# Patient Record
Sex: Female | Born: 1950 | Race: White | Hispanic: No | Marital: Married | Smoking: Never smoker
Health system: Southern US, Community
[De-identification: ages and names within clinical notes are randomized; demographics above are authoritative.]

## PROBLEM LIST (undated history)

## (undated) DIAGNOSIS — T7840XA Allergy, unspecified, initial encounter: Secondary | ICD-10-CM

## (undated) DIAGNOSIS — I10 Essential (primary) hypertension: Secondary | ICD-10-CM

## (undated) HISTORY — PX: ABDOMINAL HYSTERECTOMY: SHX81

## (undated) HISTORY — DX: Essential (primary) hypertension: I10

## (undated) HISTORY — PX: CATARACT EXTRACTION, BILATERAL: SHX1313

## (undated) HISTORY — DX: Allergy, unspecified, initial encounter: T78.40XA

---

## 1997-12-02 ENCOUNTER — Other Ambulatory Visit: Admission: RE | Admit: 1997-12-02 | Discharge: 1997-12-02 | Payer: Self-pay | Admitting: *Deleted

## 1998-08-29 ENCOUNTER — Other Ambulatory Visit: Admission: RE | Admit: 1998-08-29 | Discharge: 1998-08-29 | Payer: Self-pay | Admitting: Obstetrics and Gynecology

## 1998-11-04 ENCOUNTER — Inpatient Hospital Stay (HOSPITAL_COMMUNITY): Admission: RE | Admit: 1998-11-04 | Discharge: 1998-11-06 | Payer: Self-pay | Admitting: Obstetrics and Gynecology

## 2000-10-07 ENCOUNTER — Ambulatory Visit (HOSPITAL_COMMUNITY): Admission: RE | Admit: 2000-10-07 | Discharge: 2000-10-07 | Payer: Self-pay | Admitting: Internal Medicine

## 2001-09-15 ENCOUNTER — Other Ambulatory Visit: Admission: RE | Admit: 2001-09-15 | Discharge: 2001-09-15 | Payer: Self-pay | Admitting: Obstetrics and Gynecology

## 2004-03-30 ENCOUNTER — Other Ambulatory Visit: Admission: RE | Admit: 2004-03-30 | Discharge: 2004-03-30 | Payer: Self-pay | Admitting: Internal Medicine

## 2004-08-16 ENCOUNTER — Emergency Department (HOSPITAL_COMMUNITY): Admission: EM | Admit: 2004-08-16 | Discharge: 2004-08-16 | Payer: Self-pay | Admitting: Family Medicine

## 2007-07-09 ENCOUNTER — Emergency Department (HOSPITAL_COMMUNITY): Admission: EM | Admit: 2007-07-09 | Discharge: 2007-07-09 | Payer: Self-pay | Admitting: Family Medicine

## 2007-10-21 ENCOUNTER — Emergency Department (HOSPITAL_COMMUNITY): Admission: EM | Admit: 2007-10-21 | Discharge: 2007-10-21 | Payer: Self-pay | Admitting: Emergency Medicine

## 2008-01-19 ENCOUNTER — Ambulatory Visit (HOSPITAL_COMMUNITY): Admission: RE | Admit: 2008-01-19 | Discharge: 2008-01-19 | Payer: Self-pay | Admitting: Internal Medicine

## 2009-04-17 IMAGING — CR DG ANKLE COMPLETE 3+V*L*
2 series · 2 of 2 positions shown · non-contrast
Comparison: None available

CLINICAL DATA: Fell

LEFT ANKLE COMPLETE - 3+ VIEW

[view not recorded (1 of 2)]
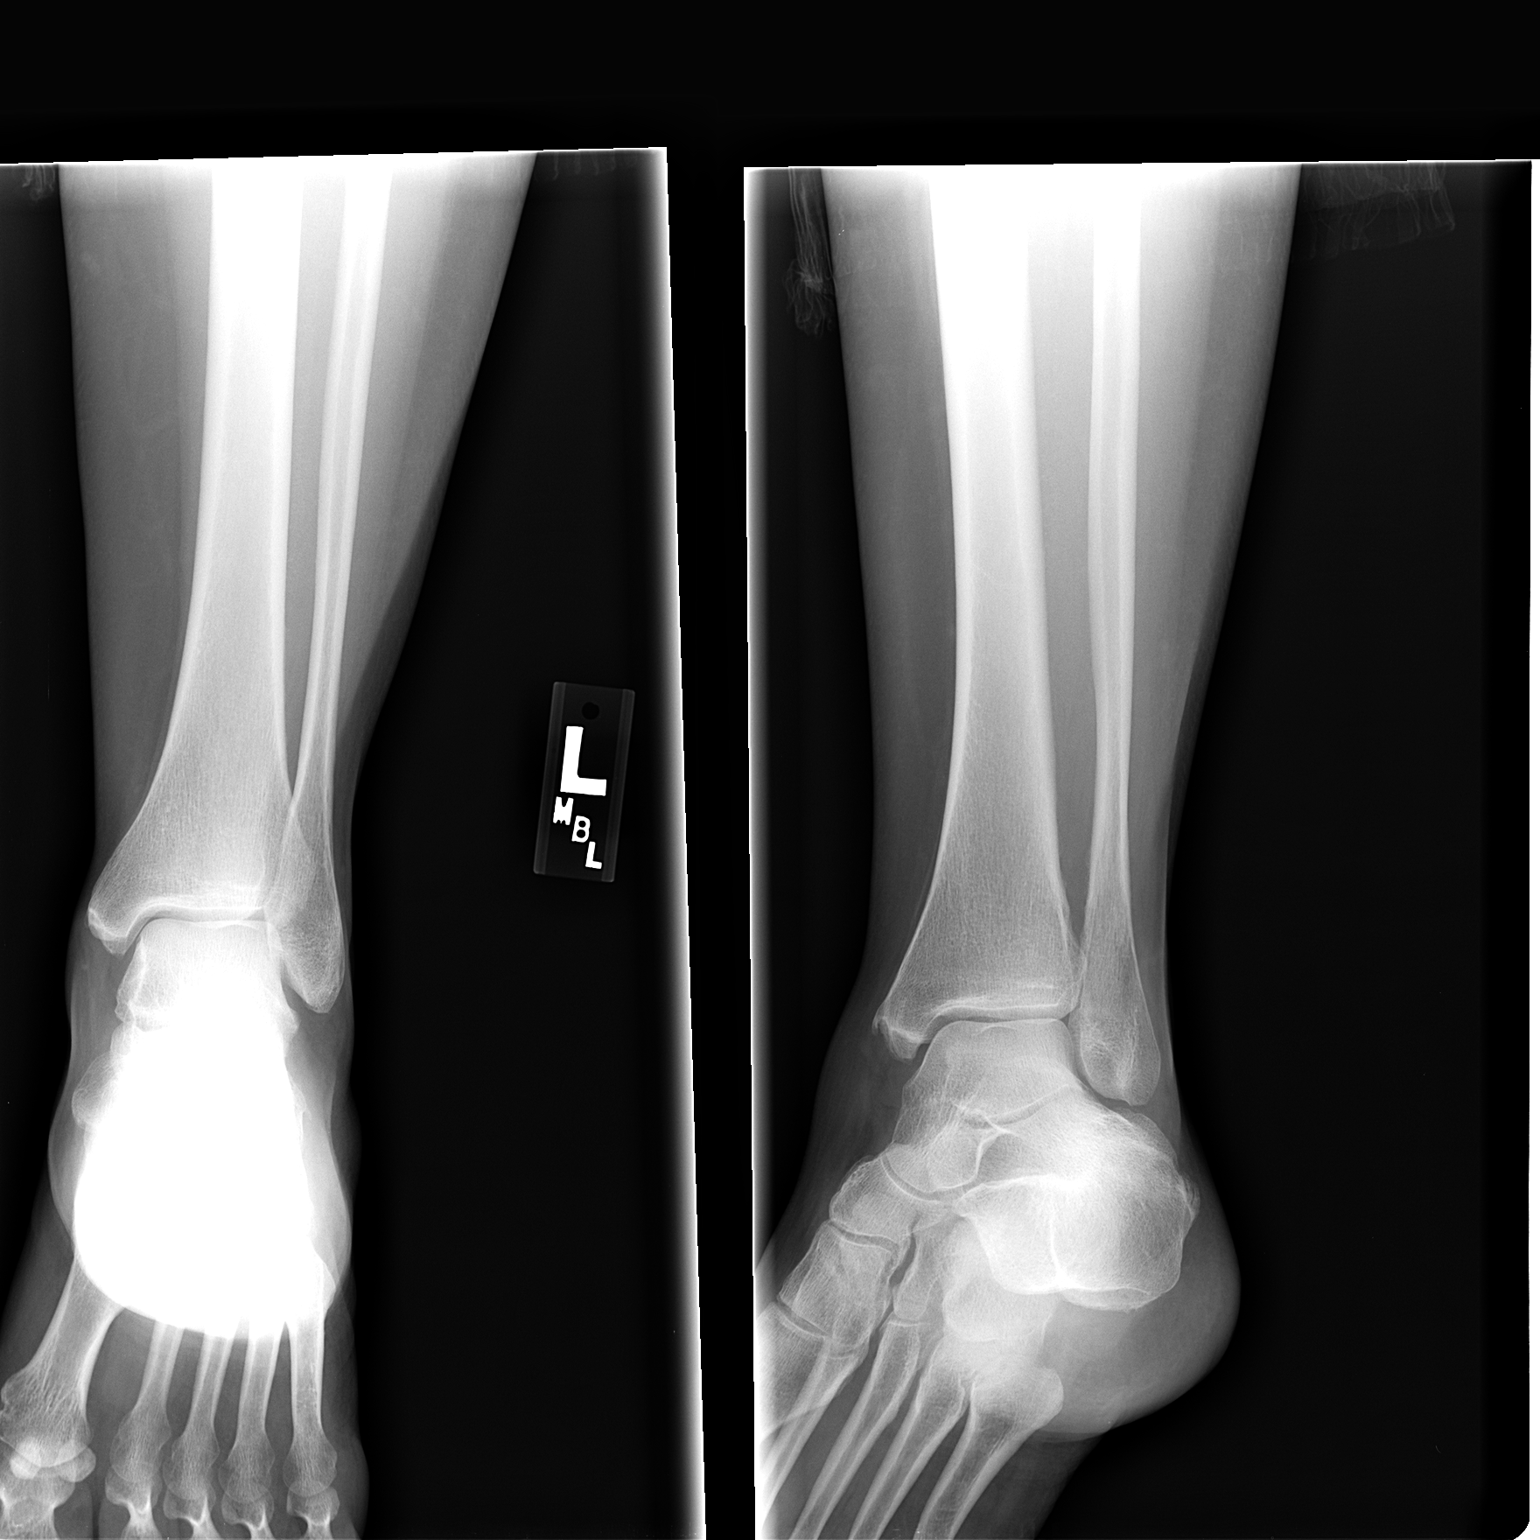

[view not recorded (2 of 2)]
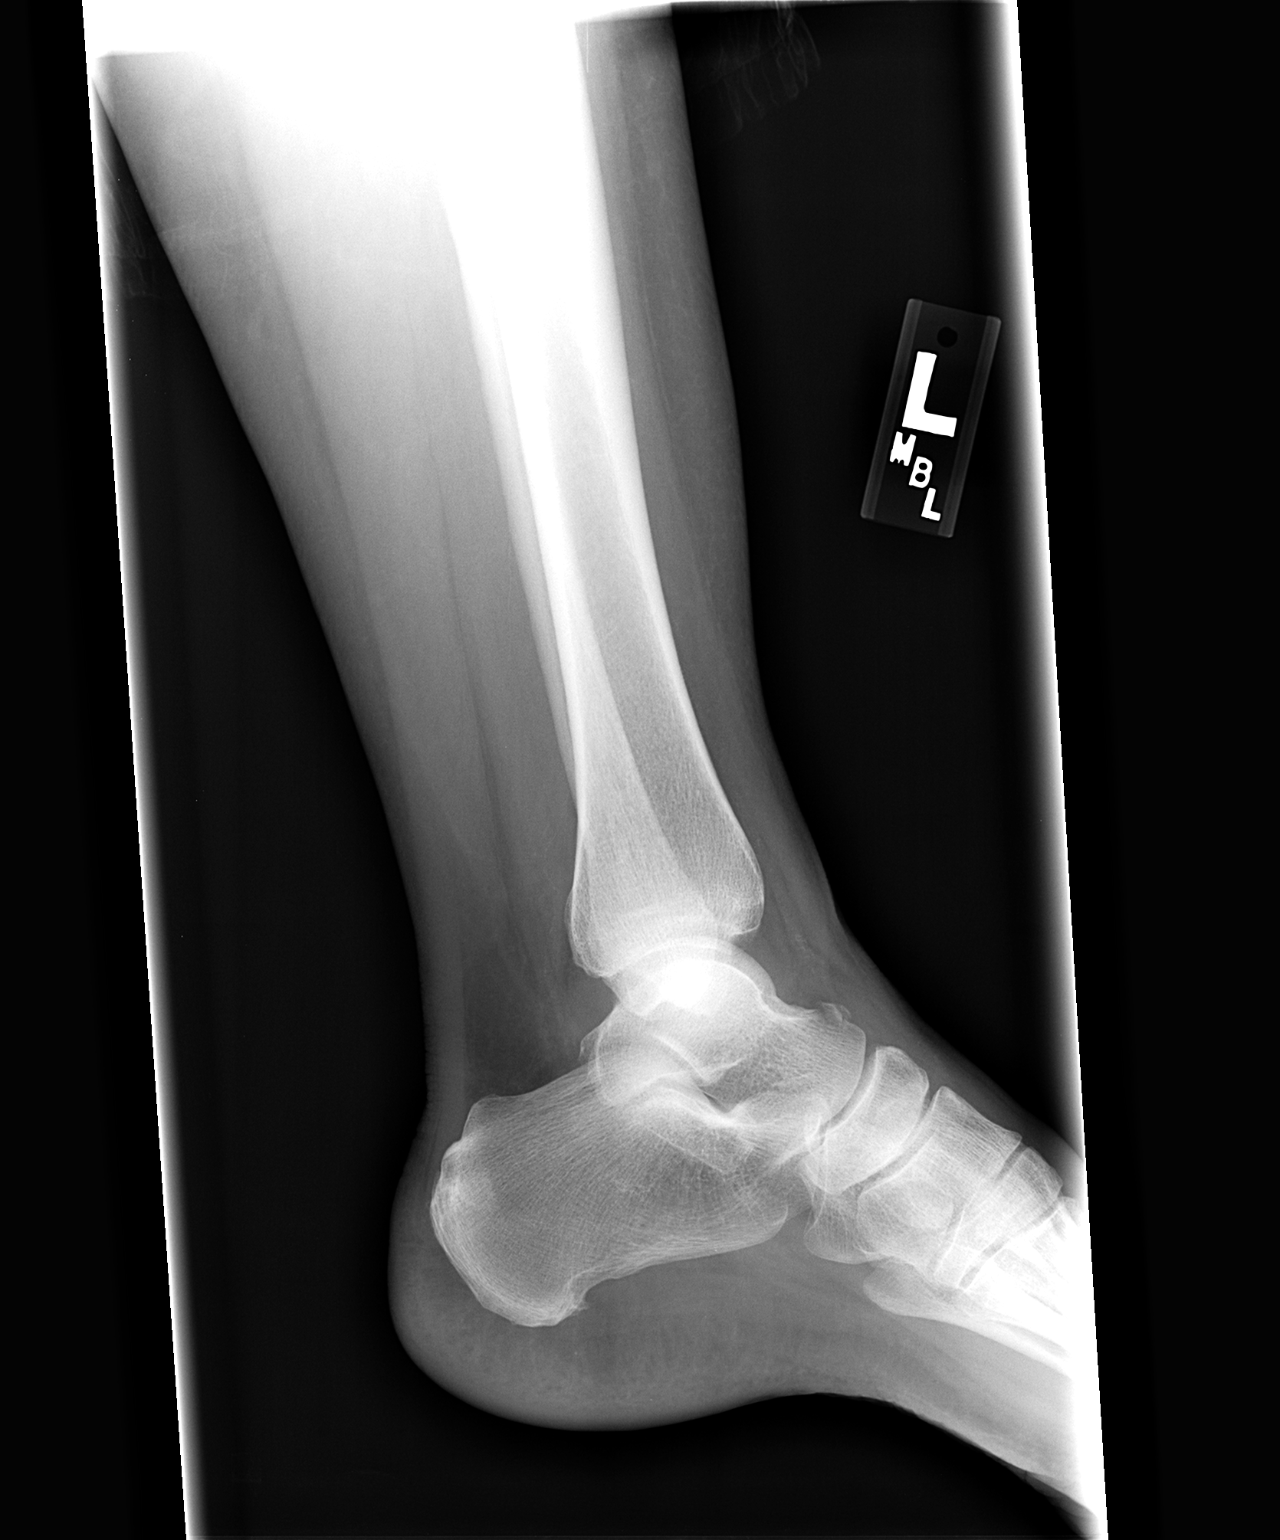

[2 of 2 positions shown; findings below may reference images not displayed]

FINDINGS: There is a fracture at the dorsal aspect of the distal
talus, small fracture fragment displaced less than 2 mm.  No
definite extension to the articular surface is evident.  Ankle
mortise is intact.  Small calcaneal spur at the plantar aponeurosis
incidentally noted.  Normal alignment and mineralization.
IMPRESSION: 1.  Minimally displaced   fracture, dorsal aspect of the distal
talus.

## 2009-05-14 ENCOUNTER — Ambulatory Visit (HOSPITAL_COMMUNITY): Admission: RE | Admit: 2009-05-14 | Discharge: 2009-05-14 | Payer: Self-pay | Admitting: Internal Medicine

## 2011-09-28 ENCOUNTER — Other Ambulatory Visit (HOSPITAL_COMMUNITY): Payer: Self-pay | Admitting: Internal Medicine

## 2011-09-28 DIAGNOSIS — Z1231 Encounter for screening mammogram for malignant neoplasm of breast: Secondary | ICD-10-CM

## 2011-10-25 ENCOUNTER — Ambulatory Visit (HOSPITAL_COMMUNITY): Payer: Self-pay

## 2011-11-18 ENCOUNTER — Ambulatory Visit (HOSPITAL_COMMUNITY)
Admission: RE | Admit: 2011-11-18 | Discharge: 2011-11-18 | Disposition: A | Discharge status: Home / Self Care | Payer: BC Managed Care – PPO | Source: Ambulatory Visit | Attending: Internal Medicine | Admitting: Internal Medicine

## 2011-11-18 DIAGNOSIS — Z1231 Encounter for screening mammogram for malignant neoplasm of breast: Secondary | ICD-10-CM

## 2012-12-27 ENCOUNTER — Other Ambulatory Visit (HOSPITAL_COMMUNITY): Payer: Self-pay | Admitting: Internal Medicine

## 2012-12-27 DIAGNOSIS — Z1231 Encounter for screening mammogram for malignant neoplasm of breast: Secondary | ICD-10-CM

## 2013-01-01 ENCOUNTER — Ambulatory Visit (HOSPITAL_COMMUNITY)
Admission: RE | Admit: 2013-01-01 | Discharge: 2013-01-01 | Disposition: A | Discharge status: Home / Self Care | Payer: BC Managed Care – PPO | Source: Ambulatory Visit | Attending: Internal Medicine | Admitting: Internal Medicine

## 2013-01-01 DIAGNOSIS — Z1231 Encounter for screening mammogram for malignant neoplasm of breast: Secondary | ICD-10-CM | POA: Insufficient documentation

## 2013-01-26 ENCOUNTER — Ambulatory Visit (INDEPENDENT_AMBULATORY_CARE_PROVIDER_SITE_OTHER): Payer: BC Managed Care – PPO | Admitting: Physician Assistant

## 2013-01-26 VITALS — BP 120/82 | HR 66 | Temp 98.4°F | Resp 18 | Ht 63.0 in | Wt 169.0 lb

## 2013-01-26 DIAGNOSIS — R21 Rash and other nonspecific skin eruption: Secondary | ICD-10-CM

## 2013-01-26 DIAGNOSIS — W57XXXA Bitten or stung by nonvenomous insect and other nonvenomous arthropods, initial encounter: Secondary | ICD-10-CM

## 2013-01-26 MED ORDER — DOXYCYCLINE HYCLATE 100 MG PO CAPS: 100.0000 mg | ORAL_CAPSULE | Freq: Two times a day (BID) | ORAL | Status: DC

## 2013-01-26 NOTE — Progress Notes (Signed)
   Patient ID: Lori Burton MRN: [id number], DOB: 04/16/1951, 62 y.o. Date of Encounter: 01/26/2013, 4:07 PM  Primary Physician: No primary provider on file.  Chief Complaint: Tick bite  HPI: 62 y.o. female presents with tick bite along the right lateral lower leg about 5-6 days ago. She at first thought it was a "fleck of blood." Upon flicking at the lesion she her it hit the ground and noticed that "it had legs." She placed it in a container for examination today. She is not certain how long the tick was attached, but it was small upon her removal. She notes a circular erythematous rash along the bite location. She complains of a mild generalized headache, some arthralgias along the elbows, and a subjective fever one evening. No diffuse rash. No N/V/D.    Past Medical History  Diagnosis Date  . Hypertension   . Allergy      Home Meds: Prior to Admission medications   Medication Sig Start Date End Date Taking? Authorizing Provider  hydrochlorothiazide (HYDRODIURIL) 25 MG tablet Take 25 mg by mouth daily.   Yes Historical Provider, MD           Allergies:  Allergies  Allergen Reactions  . Penicillins     History   Social History  . Marital Status: Married    Spouse Name: N/A    Number of Children: N/A  . Years of Education: N/A   Occupational History  . Not on file.   Social History Main Topics  . Smoking status: Never Smoker   . Smokeless tobacco: Not on file  . Alcohol Use: Not on file  . Drug Use: Not on file  . Sexual Activity: Not on file   Other Topics Concern  . Not on file   Social History Narrative  . No narrative on file     Review of Systems: Constitutional: negative for chills, fever, night sweats, weight changes, or fatigue  HEENT: negative for vision changes or hearing loss Abdominal: negative for abdominal pain, nausea, vomiting, or diarrhea Dermatological: see above Neurologic: positive for headache. negative for dizziness, or  syncope   Physical Exam: Blood pressure 120/82, pulse 66, temperature 98.4 F (36.9 C), temperature source Oral, resp. rate 18, height 5\' 3"  (1.6 m), weight 169 lb (76.658 kg), SpO2 98.00%., Body mass index is 29.94 kg/(m^2). General: Well developed, well nourished, in no acute distress. Head: Normocephalic, atraumatic, eyes without discharge, sclera non-icteric, nares are without discharge.   Neck: Supple. Full ROM.  Lungs: Clear bilaterally to auscultation without wheezes, rales, or rhonchi. Breathing is unlabored. Heart: RRR with S1 S2. No murmurs, rubs, or gallops appreciated. Msk:  Strength and tone normal for age. Extremities/Skin: Warm and dry. No clubbing or cyanosis. No edema. 3 cm circular rash with slight central clearing, giving rash a target appearance. No FB. No TTP. No secondary infection. Examination of the tick reveals a non-engorged tick with brown leg legs and and patch along its back. The tick does not appear to be engorged at this time.  Neuro: Alert and oriented X 3. Moves all extremities spontaneously. Gait is normal. CNII-XII grossly in tact. Psych:  Responds to questions appropriately with a normal affect.     ASSESSMENT AND PLAN:  62 y.o. female with tick bite to right lateral lower leg.  -Doxycycline 100 mg 1 po bid #28 no RF -Tick consistent with [employer] Dog Tick -RTC precautions  Signed, Eula Listen, PA-C 01/26/2013 4:07 PM

## 2013-04-05 ENCOUNTER — Other Ambulatory Visit: Payer: Self-pay | Admitting: Dermatology

## 2014-02-21 ENCOUNTER — Other Ambulatory Visit (HOSPITAL_COMMUNITY): Payer: Self-pay | Admitting: Internal Medicine

## 2014-02-21 DIAGNOSIS — Z1231 Encounter for screening mammogram for malignant neoplasm of breast: Secondary | ICD-10-CM

## 2014-02-22 ENCOUNTER — Ambulatory Visit (HOSPITAL_COMMUNITY)
Admission: RE | Admit: 2014-02-22 | Discharge: 2014-02-22 | Disposition: A | Discharge status: Home / Self Care | Payer: BC Managed Care – PPO | Source: Ambulatory Visit | Attending: Internal Medicine | Admitting: Internal Medicine

## 2014-02-22 DIAGNOSIS — Z1231 Encounter for screening mammogram for malignant neoplasm of breast: Secondary | ICD-10-CM | POA: Diagnosis present

## 2017-04-27 ENCOUNTER — Telehealth: Payer: Self-pay

## 2017-04-27 NOTE — Telephone Encounter (Signed)
Called pt per Dr. Verlene Mayer request to schedule for a CPE, she did not answer. Pt has not been seen here since started on Epic but did show up on our [facility] report which prompted this call.

## 2017-12-08 DIAGNOSIS — H524 Presbyopia: Secondary | ICD-10-CM | POA: Diagnosis not present

## 2017-12-08 DIAGNOSIS — H25813 Combined forms of age-related cataract, bilateral: Secondary | ICD-10-CM | POA: Diagnosis not present

## 2017-12-19 DIAGNOSIS — H2512 Age-related nuclear cataract, left eye: Secondary | ICD-10-CM | POA: Diagnosis not present

## 2017-12-19 DIAGNOSIS — H2513 Age-related nuclear cataract, bilateral: Secondary | ICD-10-CM | POA: Diagnosis not present

## 2017-12-28 ENCOUNTER — Telehealth: Payer: Self-pay | Admitting: Internal Medicine

## 2017-12-28 NOTE — Telephone Encounter (Signed)
Called patient to schedule CPE because she showed up on [facility] GAPS list as our patient.  She has not been seen here since prior to Epic conversion.  She didn't answer.  LM to call the office to schedule a CPE.  She was last contacted by Drucilla Schmidt on 04/27/17 for this same reason.  She didn't return the call at that time either.    Call #2.

## 2018-01-02 DIAGNOSIS — H25812 Combined forms of age-related cataract, left eye: Secondary | ICD-10-CM | POA: Diagnosis not present

## 2018-01-02 DIAGNOSIS — H2512 Age-related nuclear cataract, left eye: Secondary | ICD-10-CM | POA: Diagnosis not present

## 2018-01-10 DIAGNOSIS — H2511 Age-related nuclear cataract, right eye: Secondary | ICD-10-CM | POA: Diagnosis not present

## 2018-01-23 DIAGNOSIS — H2511 Age-related nuclear cataract, right eye: Secondary | ICD-10-CM | POA: Diagnosis not present

## 2018-01-23 DIAGNOSIS — H25811 Combined forms of age-related cataract, right eye: Secondary | ICD-10-CM | POA: Diagnosis not present

## 2018-03-08 ENCOUNTER — Telehealth: Payer: Self-pay

## 2018-03-08 NOTE — Telephone Encounter (Signed)
Patient appears on our [facility] gap list as our patient, left 3rd voicemail to see if patient would schedule a CPE/Establish care here.

## 2018-03-29 ENCOUNTER — Telehealth: Payer: Self-pay

## 2018-03-29 NOTE — Telephone Encounter (Signed)
Patient called she would like to schedule an appointment for elevated blood pressure. Looks like she would be a new patient? She said she hasn't been here in a long time.

## 2018-03-30 ENCOUNTER — Encounter (HOSPITAL_COMMUNITY): Payer: Self-pay | Admitting: Emergency Medicine

## 2018-03-30 ENCOUNTER — Ambulatory Visit (HOSPITAL_COMMUNITY)
Admission: EM | Admit: 2018-03-30 | Discharge: 2018-03-30 | Disposition: A | Discharge status: Home / Self Care | Payer: PPO | Attending: Family Medicine | Admitting: Family Medicine

## 2018-03-30 DIAGNOSIS — R030 Elevated blood-pressure reading, without diagnosis of hypertension: Secondary | ICD-10-CM | POA: Diagnosis not present

## 2018-03-30 MED ORDER — LISINOPRIL 20 MG PO TABS: 20.0000 mg | ORAL_TABLET | Freq: Every day | ORAL | 1 refills | Status: DC

## 2018-03-30 NOTE — ED Provider Notes (Signed)
[place]   [id number] 03/30/18 Arrival Time: 1101  CC: High blood pressure  SUBJECTIVE:  Lori Burton is a 67 y.o. female who presents for elevated blood pressure.  Attributes her symptoms to a recent eye surgery she had in August.  Hx of HTN.  States blood pressure on average is 140/90.  Was elevated today with a recorded blood pressure of 180/102 at home.  189/89 in office.  Has taken blood pressure medication in the past.  Does not have a PCP.  Denies HA, vision changes, dizziness, lightheadedness, chest pain, shortness of breath, numbness or tingling in extremities, abdominal pain, changes in bowel or bladder habits.    No LMP recorded. Patient has had a hysterectomy.  ROS: As per HPI.  Past Medical History:  Diagnosis Date  . Allergy   . Hypertension    Past Surgical History:  Procedure Laterality Date  . ABDOMINAL HYSTERECTOMY     Allergies  Allergen Reactions  . Penicillins    No current facility-administered medications on file prior to encounter.    Current Outpatient Medications on File Prior to Encounter  Medication Sig Dispense Refill  . Magnesium Gluconate (MAGNESIUM 27 PO) Take by mouth.    Marland Kitchen POTASSIUM PO Take by mouth.    . Thiamine HCl (VITAMIN B-1 PO) Take by mouth.     Social History   Socioeconomic History  . Marital status: Married    Spouse name: Not on file  . Number of children: Not on file  . Years of education: Not on file  . Highest education level: Not on file  Occupational History  . Not on file  Social Needs  . Financial resource strain: Not on file  . Food insecurity:    Worry: Not on file    Inability: Not on file  . Transportation needs:    Medical: Not on file    Non-medical: Not on file  Tobacco Use  . Smoking status: Never Smoker  Substance and Sexual Activity  . Alcohol use: Not on file  . Drug use: Not on file  . Sexual activity: Not on file  Lifestyle  . Physical activity:    Days per week: Not  on file    Minutes per session: Not on file  . Stress: Not on file  Relationships  . Social connections:    Talks on phone: Not on file    Gets together: Not on file    Attends religious service: Not on file    Active member of club or organization: Not on file    Attends meetings of clubs or organizations: Not on file    Relationship status: Not on file  . Intimate partner violence:    Fear of current or ex partner: Not on file    Emotionally abused: Not on file    Physically abused: Not on file    Forced sexual activity: Not on file  Other Topics Concern  . Not on file  Social History Narrative  . Not on file   Family History  Problem Relation Age of Onset  . Cancer Mother   . Cancer Father   . Cancer Sister     OBJECTIVE:  Vitals:   03/30/18 1143  BP: (!) 189/89  Pulse: 69  Resp: 16  Temp: 98.2 F (36.8 C)  SpO2: 99%    General appearance: Alert and oriented to person, and place; pleasant; well-appearing HEENT: NCAT.  PERRL. EOMI.  Oropharynx clear.  Lungs: clear to  auscultation bilaterally without adventitious breath sounds Heart: regular rate and rhythm.  No carotid bruits.  Radial pulses 2+ symmetrical bilaterally Extremities: no edema; symmetrical with no gross deformities Skin: warm and dry Neurologic: normal gait Psychological: alert and cooperative; normal mood and affect  ASSESSMENT & PLAN:  1. Elevated blood pressure reading     Meds ordered this encounter  Medications  . lisinopril (PRINIVIL,ZESTRIL) 20 MG tablet    Sig: Take 1 tablet (20 mg total) by mouth daily.    Dispense:  30 tablet    Refill:  1    Order Specific Question:   Supervising Provider    Answer:   Wynona Luna [[id number]]   Please continue to monitor blood pressure at home and keep a log Eat a well balanced diet of fruits, vegetables and lean meats.  Avoid foods high in fat and salt Drink water.  At least half your body weight in ounces Exercise for at least 30  minutes daily I will start you on blood pressure medication today.  Take as directed.    I have included the name of a local PCP in the area Return or go to the ED if you have any new or worsening symptoms such as vision changes, fatigue, dizziness, chest pain, shortness of breath, nausea, swelling in your hands or feet, urinary symptoms, etc...  Reviewed expectations re: course of current medical issues. Questions answered. Outlined signs and symptoms indicating need for more acute intervention. Patient verbalized understanding. After Visit Summary given.   Lestine Box, PA-C 03/30/18 1307

## 2018-03-30 NOTE — Discharge Instructions (Signed)
Please continue to monitor blood pressure at home and keep a log Eat a well balanced diet of fruits, vegetables and lean meats.  Avoid foods high in fat and salt Drink water.  At least half your body weight in ounces Exercise for at least 30 minutes daily I will start you on blood pressure medication today.  Take as directed.    I have included the name of a local PCP in the area Return or go to the ED if you have any new or worsening symptoms such as vision changes, fatigue, dizziness, chest pain, shortness of breath, nausea, swelling in your hands or feet, urinary symptoms, etc..Marland Kitchen

## 2018-03-30 NOTE — ED Triage Notes (Signed)
Pt recently had issue with PCP due to insurance, tried contacting today for her high blood pressure and told her to come here. Pt is not on blood pressure medicines.

## 2018-03-30 NOTE — Telephone Encounter (Signed)
Araceli, Ms Dellie Burns has never been a patient here and was contacted 3 times because she is on a [facility] list. She was removed from the list for failure to make an appointment and will not be seen here. Needs to find another PCP.

## 2018-03-31 NOTE — Telephone Encounter (Signed)
I spoke with patient and advised that she would need to find another PCP.  She was contacted on at least 3 occasions and did not respond back.  For this reason, she will need to find another PCP.  She verbalized understanding of our conversation.  I did provide her with [place]'s phone number and she also has other phone numbers to call as well.

## 2018-06-04 ENCOUNTER — Encounter (HOSPITAL_COMMUNITY): Payer: Self-pay | Admitting: *Deleted

## 2018-06-04 ENCOUNTER — Ambulatory Visit (HOSPITAL_COMMUNITY)
Admission: EM | Admit: 2018-06-04 | Discharge: 2018-06-04 | Disposition: A | Discharge status: Home / Self Care | Payer: PPO | Attending: Internal Medicine | Admitting: Internal Medicine

## 2018-06-04 DIAGNOSIS — Z760 Encounter for issue of repeat prescription: Secondary | ICD-10-CM | POA: Diagnosis not present

## 2018-06-04 DIAGNOSIS — I10 Essential (primary) hypertension: Secondary | ICD-10-CM | POA: Diagnosis not present

## 2018-06-04 MED ORDER — LISINOPRIL 20 MG PO TABS: 20.0000 mg | ORAL_TABLET | Freq: Every day | ORAL | 1 refills | Status: AC

## 2018-06-04 NOTE — ED Triage Notes (Signed)
Reports ran out of HTN med 1 wk ago; has appt with PCP 06/15/18.

## 2018-06-04 NOTE — Discharge Instructions (Addendum)
Please continue to monitor blood pressure at home and keep a log Eat a well balanced diet of fruits, vegetables and lean meats.  Avoid foods high in fat and salt Drink water.  At least half your body weight in ounces Exercise for at least 30 minutes daily Follow up with Dr. Mannie Stabile on 06/15/18 Return or go to the ED if you have any new or worsening symptoms such as vision changes, fatigue, dizziness, chest pain, shortness of breath, nausea, swelling in your hands or feet, urinary symptoms, etc..Marland Kitchen

## 2018-06-04 NOTE — ED Provider Notes (Signed)
[place]   [id number] 06/04/18 Arrival Time: 8657  Cc: Blood pressure med refill  SUBJECTIVE:  Lori Burton is a 67 y.o. female who presents for blood pressure med refill.  Hx of HTN.  States blood pressure on average is 140/80-90.  189/96 in office.  Ran out of lisinopril about 2 weeks ago, and blood pressure has slowly been increasing.  Has appointment with PCP, Dr. Mannie Stabile at [facility] on 06/15/18.  Denies HA, vision changes, dizziness, lightheadedness, chest pain, shortness of breath, numbness or tingling in extremities, abdominal pain, changes in bowel or bladder habits.    ROS: As per HPI.  Past Medical History:  Diagnosis Date  . Allergy   . Hypertension    Past Surgical History:  Procedure Laterality Date  . ABDOMINAL HYSTERECTOMY    . CATARACT EXTRACTION, BILATERAL     Allergies  Allergen Reactions  . Penicillins    No current facility-administered medications on file prior to encounter.    Current Outpatient Medications on File Prior to Encounter  Medication Sig Dispense Refill  . Magnesium Gluconate (MAGNESIUM 27 PO) Take by mouth.    Marland Kitchen POTASSIUM PO Take by mouth.    . Thiamine HCl (VITAMIN B-1 PO) Take by mouth.      Social History   Socioeconomic History  . Marital status: Married    Spouse name: Not on file  . Number of children: Not on file  . Years of education: Not on file  . Highest education level: Not on file  Occupational History  . Not on file  Social Needs  . Financial resource strain: Not on file  . Food insecurity:    Worry: Not on file    Inability: Not on file  . Transportation needs:    Medical: Not on file    Non-medical: Not on file  Tobacco Use  . Smoking status: Never Smoker  . Smokeless tobacco: Never Used  Substance and Sexual Activity  . Alcohol use: Not Currently  . Drug use: Never  . Sexual activity: Not on file  Lifestyle  . Physical activity:    Days per week: Not on file    Minutes per session: Not  on file  . Stress: Not on file  Relationships  . Social connections:    Talks on phone: Not on file    Gets together: Not on file    Attends religious service: Not on file    Active member of club or organization: Not on file    Attends meetings of clubs or organizations: Not on file    Relationship status: Not on file  . Intimate partner violence:    Fear of current or ex partner: Not on file    Emotionally abused: Not on file    Physically abused: Not on file    Forced sexual activity: Not on file  Other Topics Concern  . Not on file  Social History Narrative  . Not on file   Family History  Problem Relation Age of Onset  . Cancer Mother   . Cancer Father   . Cancer Sister      OBJECTIVE:  Vitals:   06/04/18 1012  BP: (!) 189/96  Pulse: 76  Resp: 16  Temp: 98.2 F (36.8 C)  TempSrc: Oral  SpO2: 100%     General appearance: Alert, well-appearing HEENT:NCAT; Ears: EACs clear Eyes: PERRL.  EOM grossly intact.  Nose: patent; tonsils nonerythematous, uvula midline  Neck: supple without LAD Lungs: CTAB  Heart: regular rate and rhythm.  Radial pulses 2+ symmetrical bilaterally Skin: warm and dry Psychological: alert and cooperative; normal mood and affect  ASSESSMENT & PLAN:  1. Medication refill   2. Essential hypertension     Meds ordered this encounter  Medications  . lisinopril (PRINIVIL,ZESTRIL) 20 MG tablet    Sig: Take 1 tablet (20 mg total) by mouth daily.    Dispense:  30 tablet    Refill:  1    Order Specific Question:   Supervising Provider    Answer:   Raylene Everts [[id number]]    Please continue to monitor blood pressure at home and keep a log Eat a well balanced diet of fruits, vegetables and lean meats.  Avoid foods high in fat and salt Drink water.  At least half your body weight in ounces Exercise for at least 30 minutes daily Follow up with Dr. Mannie Stabile on 06/15/18 Return or go to the ED if you have any new or worsening symptoms such as  vision changes, fatigue, dizziness, chest pain, shortness of breath, nausea, swelling in your hands or feet, urinary symptoms, etc...    Reviewed expectations re: course of current medical issues. Questions answered. Outlined signs and symptoms indicating need for more acute intervention. Patient verbalized understanding. After Visit Summary given.          Lestine Box, PA-C 06/04/18 1134

## 2018-06-15 DIAGNOSIS — Z1211 Encounter for screening for malignant neoplasm of colon: Secondary | ICD-10-CM | POA: Diagnosis not present

## 2018-06-15 DIAGNOSIS — Z1239 Encounter for other screening for malignant neoplasm of breast: Secondary | ICD-10-CM | POA: Diagnosis not present

## 2018-06-15 DIAGNOSIS — Z23 Encounter for immunization: Secondary | ICD-10-CM | POA: Diagnosis not present

## 2018-06-15 DIAGNOSIS — I10 Essential (primary) hypertension: Secondary | ICD-10-CM | POA: Diagnosis not present

## 2018-06-19 DIAGNOSIS — Z1231 Encounter for screening mammogram for malignant neoplasm of breast: Secondary | ICD-10-CM | POA: Diagnosis not present

## 2018-07-17 DIAGNOSIS — I10 Essential (primary) hypertension: Secondary | ICD-10-CM | POA: Diagnosis not present

## 2019-03-06 DIAGNOSIS — E663 Overweight: Secondary | ICD-10-CM | POA: Diagnosis not present

## 2019-03-06 DIAGNOSIS — I10 Essential (primary) hypertension: Secondary | ICD-10-CM | POA: Diagnosis not present

## 2019-03-06 DIAGNOSIS — E7800 Pure hypercholesterolemia, unspecified: Secondary | ICD-10-CM | POA: Diagnosis not present

## 2019-03-21 DIAGNOSIS — Z85828 Personal history of other malignant neoplasm of skin: Secondary | ICD-10-CM | POA: Diagnosis not present

## 2019-03-21 DIAGNOSIS — L821 Other seborrheic keratosis: Secondary | ICD-10-CM | POA: Diagnosis not present

## 2019-04-23 DIAGNOSIS — I10 Essential (primary) hypertension: Secondary | ICD-10-CM | POA: Diagnosis not present

## 2019-04-23 DIAGNOSIS — Z23 Encounter for immunization: Secondary | ICD-10-CM | POA: Diagnosis not present

## 2019-05-16 DIAGNOSIS — Z1211 Encounter for screening for malignant neoplasm of colon: Secondary | ICD-10-CM | POA: Diagnosis not present

## 2019-07-23 DIAGNOSIS — Z1231 Encounter for screening mammogram for malignant neoplasm of breast: Secondary | ICD-10-CM | POA: Diagnosis not present

## 2020-04-28 DIAGNOSIS — H04123 Dry eye syndrome of bilateral lacrimal glands: Secondary | ICD-10-CM | POA: Diagnosis not present

## 2020-04-28 DIAGNOSIS — H26493 Other secondary cataract, bilateral: Secondary | ICD-10-CM | POA: Diagnosis not present

## 2020-04-28 DIAGNOSIS — Z961 Presence of intraocular lens: Secondary | ICD-10-CM | POA: Diagnosis not present

## 2020-06-19 DIAGNOSIS — I10 Essential (primary) hypertension: Secondary | ICD-10-CM | POA: Diagnosis not present

## 2020-06-19 DIAGNOSIS — E7800 Pure hypercholesterolemia, unspecified: Secondary | ICD-10-CM | POA: Diagnosis not present

## 2020-09-17 DIAGNOSIS — Z1231 Encounter for screening mammogram for malignant neoplasm of breast: Secondary | ICD-10-CM | POA: Diagnosis not present

## 2020-12-01 DIAGNOSIS — Z1211 Encounter for screening for malignant neoplasm of colon: Secondary | ICD-10-CM | POA: Diagnosis not present

## 2020-12-01 DIAGNOSIS — E7800 Pure hypercholesterolemia, unspecified: Secondary | ICD-10-CM | POA: Diagnosis not present

## 2020-12-01 DIAGNOSIS — Z780 Asymptomatic menopausal state: Secondary | ICD-10-CM | POA: Diagnosis not present

## 2020-12-01 DIAGNOSIS — Z0000 Encounter for general adult medical examination without abnormal findings: Secondary | ICD-10-CM | POA: Diagnosis not present

## 2020-12-01 DIAGNOSIS — Z789 Other specified health status: Secondary | ICD-10-CM | POA: Diagnosis not present

## 2020-12-01 DIAGNOSIS — I10 Essential (primary) hypertension: Secondary | ICD-10-CM | POA: Diagnosis not present

## 2021-04-22 DIAGNOSIS — H04123 Dry eye syndrome of bilateral lacrimal glands: Secondary | ICD-10-CM | POA: Diagnosis not present

## 2021-04-22 DIAGNOSIS — H26493 Other secondary cataract, bilateral: Secondary | ICD-10-CM | POA: Diagnosis not present

## 2021-04-22 DIAGNOSIS — Z961 Presence of intraocular lens: Secondary | ICD-10-CM | POA: Diagnosis not present

## 2021-05-29 DIAGNOSIS — Z1211 Encounter for screening for malignant neoplasm of colon: Secondary | ICD-10-CM | POA: Diagnosis not present

## 2021-05-29 DIAGNOSIS — E7800 Pure hypercholesterolemia, unspecified: Secondary | ICD-10-CM | POA: Diagnosis not present

## 2021-05-29 DIAGNOSIS — Z23 Encounter for immunization: Secondary | ICD-10-CM | POA: Diagnosis not present

## 2021-05-29 DIAGNOSIS — Z789 Other specified health status: Secondary | ICD-10-CM | POA: Diagnosis not present

## 2021-05-29 DIAGNOSIS — I10 Essential (primary) hypertension: Secondary | ICD-10-CM | POA: Diagnosis not present

## 2021-06-10 DIAGNOSIS — Z1211 Encounter for screening for malignant neoplasm of colon: Secondary | ICD-10-CM | POA: Diagnosis not present

## 2021-09-23 DIAGNOSIS — Z1231 Encounter for screening mammogram for malignant neoplasm of breast: Secondary | ICD-10-CM | POA: Diagnosis not present

## 2021-09-23 DIAGNOSIS — Z780 Asymptomatic menopausal state: Secondary | ICD-10-CM | POA: Diagnosis not present

## 2021-09-23 DIAGNOSIS — M8589 Other specified disorders of bone density and structure, multiple sites: Secondary | ICD-10-CM | POA: Diagnosis not present

## 2021-09-28 DIAGNOSIS — C44712 Basal cell carcinoma of skin of right lower limb, including hip: Secondary | ICD-10-CM | POA: Diagnosis not present

## 2021-09-28 DIAGNOSIS — D485 Neoplasm of uncertain behavior of skin: Secondary | ICD-10-CM | POA: Diagnosis not present

## 2021-09-28 DIAGNOSIS — Z85828 Personal history of other malignant neoplasm of skin: Secondary | ICD-10-CM | POA: Diagnosis not present

## 2021-12-09 DIAGNOSIS — E7800 Pure hypercholesterolemia, unspecified: Secondary | ICD-10-CM | POA: Diagnosis not present

## 2021-12-09 DIAGNOSIS — Z0000 Encounter for general adult medical examination without abnormal findings: Secondary | ICD-10-CM | POA: Diagnosis not present

## 2021-12-09 DIAGNOSIS — I10 Essential (primary) hypertension: Secondary | ICD-10-CM | POA: Diagnosis not present

## 2021-12-09 DIAGNOSIS — M8589 Other specified disorders of bone density and structure, multiple sites: Secondary | ICD-10-CM | POA: Diagnosis not present

## 2021-12-09 DIAGNOSIS — Z1159 Encounter for screening for other viral diseases: Secondary | ICD-10-CM | POA: Diagnosis not present

## 2021-12-29 DIAGNOSIS — Z85828 Personal history of other malignant neoplasm of skin: Secondary | ICD-10-CM | POA: Diagnosis not present

## 2021-12-29 DIAGNOSIS — L905 Scar conditions and fibrosis of skin: Secondary | ICD-10-CM | POA: Diagnosis not present

## 2022-04-28 DIAGNOSIS — Z961 Presence of intraocular lens: Secondary | ICD-10-CM | POA: Diagnosis not present

## 2022-04-28 DIAGNOSIS — H04123 Dry eye syndrome of bilateral lacrimal glands: Secondary | ICD-10-CM | POA: Diagnosis not present

## 2022-04-28 DIAGNOSIS — H26493 Other secondary cataract, bilateral: Secondary | ICD-10-CM | POA: Diagnosis not present

## 2022-05-25 DIAGNOSIS — I10 Essential (primary) hypertension: Secondary | ICD-10-CM | POA: Diagnosis not present

## 2022-05-25 DIAGNOSIS — E7800 Pure hypercholesterolemia, unspecified: Secondary | ICD-10-CM | POA: Diagnosis not present

## 2022-05-25 DIAGNOSIS — Z23 Encounter for immunization: Secondary | ICD-10-CM | POA: Diagnosis not present

## 2022-09-29 DIAGNOSIS — Z1231 Encounter for screening mammogram for malignant neoplasm of breast: Secondary | ICD-10-CM | POA: Diagnosis not present

## 2022-12-31 DIAGNOSIS — Z1211 Encounter for screening for malignant neoplasm of colon: Secondary | ICD-10-CM | POA: Diagnosis not present

## 2022-12-31 DIAGNOSIS — Z13220 Encounter for screening for lipoid disorders: Secondary | ICD-10-CM | POA: Diagnosis not present

## 2022-12-31 DIAGNOSIS — Z136 Encounter for screening for cardiovascular disorders: Secondary | ICD-10-CM | POA: Diagnosis not present

## 2022-12-31 DIAGNOSIS — I10 Essential (primary) hypertension: Secondary | ICD-10-CM | POA: Diagnosis not present

## 2022-12-31 DIAGNOSIS — Z0000 Encounter for general adult medical examination without abnormal findings: Secondary | ICD-10-CM | POA: Diagnosis not present

## 2022-12-31 DIAGNOSIS — M8580 Other specified disorders of bone density and structure, unspecified site: Secondary | ICD-10-CM | POA: Diagnosis not present

## 2022-12-31 DIAGNOSIS — E559 Vitamin D deficiency, unspecified: Secondary | ICD-10-CM | POA: Diagnosis not present

## 2022-12-31 DIAGNOSIS — Z789 Other specified health status: Secondary | ICD-10-CM | POA: Diagnosis not present

## 2022-12-31 DIAGNOSIS — Z23 Encounter for immunization: Secondary | ICD-10-CM | POA: Diagnosis not present

## 2022-12-31 DIAGNOSIS — Z7185 Encounter for immunization safety counseling: Secondary | ICD-10-CM | POA: Diagnosis not present

## 2023-05-05 DIAGNOSIS — Z1211 Encounter for screening for malignant neoplasm of colon: Secondary | ICD-10-CM | POA: Diagnosis not present

## 2023-08-09 DIAGNOSIS — H40013 Open angle with borderline findings, low risk, bilateral: Secondary | ICD-10-CM | POA: Diagnosis not present

## 2023-08-09 DIAGNOSIS — H40053 Ocular hypertension, bilateral: Secondary | ICD-10-CM | POA: Diagnosis not present

## 2023-10-05 DIAGNOSIS — M8588 Other specified disorders of bone density and structure, other site: Secondary | ICD-10-CM | POA: Diagnosis not present

## 2023-10-05 DIAGNOSIS — Z1231 Encounter for screening mammogram for malignant neoplasm of breast: Secondary | ICD-10-CM | POA: Diagnosis not present

## 2024-01-13 DIAGNOSIS — M810 Age-related osteoporosis without current pathological fracture: Secondary | ICD-10-CM | POA: Diagnosis not present

## 2024-01-13 DIAGNOSIS — I10 Essential (primary) hypertension: Secondary | ICD-10-CM | POA: Diagnosis not present

## 2024-01-13 DIAGNOSIS — Z23 Encounter for immunization: Secondary | ICD-10-CM | POA: Diagnosis not present

## 2024-01-13 DIAGNOSIS — Z1211 Encounter for screening for malignant neoplasm of colon: Secondary | ICD-10-CM | POA: Diagnosis not present

## 2024-01-13 DIAGNOSIS — E7800 Pure hypercholesterolemia, unspecified: Secondary | ICD-10-CM | POA: Diagnosis not present

## 2024-01-13 DIAGNOSIS — Z0000 Encounter for general adult medical examination without abnormal findings: Secondary | ICD-10-CM | POA: Diagnosis not present

## 2024-01-13 DIAGNOSIS — Z789 Other specified health status: Secondary | ICD-10-CM | POA: Diagnosis not present
